# Patient Record
Sex: Male | Born: 1980 | Race: White | Hispanic: No | Marital: Married | Smoking: Current every day smoker
Health system: Southern US, Community
[De-identification: ages and names within clinical notes are randomized; demographics above are authoritative.]

---

## 2016-04-14 ENCOUNTER — Emergency Department
Admission: EM | Admit: 2016-04-14 | Discharge: 2016-04-14 | Disposition: A | Discharge status: Home / Self Care | Payer: Commercial Managed Care - HMO | Source: Home / Self Care | Attending: Family Medicine | Admitting: Family Medicine

## 2016-04-14 ENCOUNTER — Encounter: Payer: Self-pay | Admitting: Emergency Medicine

## 2016-04-14 DIAGNOSIS — H6693 Otitis media, unspecified, bilateral: Secondary | ICD-10-CM | POA: Diagnosis not present

## 2016-04-14 DIAGNOSIS — H6123 Impacted cerumen, bilateral: Secondary | ICD-10-CM

## 2016-04-14 MED ORDER — AMOXICILLIN 875 MG PO TABS: 875.0000 mg | ORAL_TABLET | Freq: Two times a day (BID) | ORAL | 0 refills | Status: DC

## 2016-04-14 MED ORDER — PREDNISONE 20 MG PO TABS: ORAL_TABLET | ORAL | 0 refills | Status: DC

## 2016-04-14 NOTE — ED Provider Notes (Signed)
[facility]    CSN: [id number] Arrival date & time: 04/14/16  1659     History   Chief Complaint Chief Complaint  Patient presents with  . Otalgia    HPI Raymond Cohen is a 36 y.o. male.   Patient states that he developed increased sinus congestion about two weeks ago.  One week ago his ears felt clogged, and now he has bilateral ear pain.  No fevers, chills, and sweats.  No URI symptoms.   The history is provided by the patient.    History reviewed. No pertinent past medical history.  There are no active problems to display for this patient.   History reviewed. No pertinent surgical history.     Home Medications    Prior to Admission medications   Medication Sig Start Date End Date Taking? Authorizing Provider  amoxicillin (AMOXIL) 875 MG tablet Take 1 tablet (875 mg total) by mouth 2 (two) times daily. 04/14/16   Lattie Haw, MD  predniSONE (DELTASONE) 20 MG tablet Take one tab by mouth twice daily for 5 days, then one daily. Take with food. 04/14/16   Lattie Haw, MD    Family History No family history on file.  Social History Social History  Substance Use Topics  . Smoking status: Current Every Day Smoker    Packs/day: 1.00    Types: Cigarettes  . Smokeless tobacco: Never Used  . Alcohol use Yes     Allergies   Patient has no known allergies.   Review of Systems Review of Systems No sore throat No cough No pleuritic pain No wheezing + nasal congestion + post-nasal drainage No sinus pain/pressure No itchy/red eyes + earache No hemoptysis No SOB No fever/chills No nausea No vomiting No abdominal pain No diarrhea No urinary symptoms No skin rash No fatigue No myalgias No headache Used OTC meds without relief   Physical Exam Triage Vital Signs ED Triage Vitals  Enc Vitals Group     BP 04/14/16 1726 (!) 144/103     Pulse Rate 04/14/16 1726 87     Resp --      Temp 04/14/16 1726 98.1 F (36.7 C)     Temp  Source 04/14/16 1726 Oral     SpO2 04/14/16 1726 97 %     Weight 04/14/16 1726 176 lb 8 oz (80.1 kg)     Height 04/14/16 1726 5\' 10"  (1.778 m)     Head Circumference --      Peak Flow --      Pain Score 04/14/16 1727 6     Pain Loc --      Pain Edu? --      Excl. in GC? --    No data found.   Updated Vital Signs BP (!) 144/103 (BP Location: Left Arm)   Pulse 87   Temp 98.1 F (36.7 C) (Oral)   Ht 5\' 10"  (1.778 m)   Wt 176 lb 8 oz (80.1 kg)   SpO2 97%   BMI 25.33 kg/m   Visual Acuity Right Eye Distance:   Left Eye Distance:   Bilateral Distance:    Right Eye Near:   Left Eye Near:    Bilateral Near:     Physical Exam Nursing notes and Vital Signs reviewed. Appearance:  Patient appears stated age, and in no acute distress Eyes:  Pupils are equal, round, and reactive to light and accomodation.  Extraocular movement is intact.  Conjunctivae are not inflamed  Ears:  Both canals occluded with cerumen.  Post lavage, the left tympanic membrane is erythematous with decreased landmarks.  The right canal is clear of cerumen except for a membranous fragment medially; consequently cannot completely visualize right tympanic membrane. Nose:  Mildly congested turbinates.  No sinus tenderness.   Pharynx:  Normal Neck:  Supple.  No adenopathy.    UC Treatments / Results  Labs (all labs ordered are listed, but only abnormal results are displayed)  Labs Reviewed -   Tympanometry:  Right ear tympanogram low peak height ("flat line") and large ear volume; Left ear tympanogram low peak height  EKG  EKG Interpretation None       Radiology No results found.  Procedures Procedures  Ear lavage by nurse.  Medications Ordered in UC Medications - No data to display   Initial Impression / Assessment and Plan / UC Course  I have reviewed the triage vital signs and the nursing notes.  Pertinent labs & imaging results that were available during my care of the patient were  reviewed by me and considered in my medical decision making (see chart for details).    Begin prednisone burst/taper, and amoxicillin. May add Pseudoephedrine (30mg , one or two every 4 to 6 hours) for sinus congestion.  Get adequate rest.   May use Afrin nasal spray (or generic oxymetazoline) twice daily for about 5 days and then discontinue.  Also recommend using saline nasal spray several times daily and saline nasal irrigation (AYR is a common brand).  Use Flonase nasal spray each morning after using Afrin nasal spray and saline nasal irrigation. Recommend follow-up with ENT as soon as possible.  Final Clinical Impressions(s) / UC Diagnoses   Final diagnoses:  Bilateral impacted cerumen  Bilateral otitis media, unspecified otitis media type    New Prescriptions New Prescriptions   AMOXICILLIN (AMOXIL) 875 MG TABLET    Take 1 tablet (875 mg total) by mouth 2 (two) times daily.   PREDNISONE (DELTASONE) 20 MG TABLET    Take one tab by mouth twice daily for 5 days, then one daily. Take with food.     Lattie HawStephen A Carsin Randazzo, MD 04/22/16 [phone]

## 2016-04-14 NOTE — ED Triage Notes (Signed)
Pt c/o bilateral ear pain x 1 week, no drainage, head congestion.

## 2016-04-14 NOTE — Discharge Instructions (Signed)
May add Pseudoephedrine (30mg, one or two every 4 to 6 hours) for sinus congestion.  Get adequate rest.   °May use Afrin nasal spray (or generic oxymetazoline) twice daily for about 5 days and then discontinue.  Also recommend using saline nasal spray several times daily and saline nasal irrigation (AYR is a common brand).  Use Flonase nasal spray each morning after using Afrin nasal spray and saline nasal irrigation. °  °

## 2016-04-15 ENCOUNTER — Telehealth: Payer: Self-pay

## 2016-07-29 ENCOUNTER — Encounter: Payer: Self-pay | Admitting: *Deleted

## 2016-07-29 ENCOUNTER — Emergency Department (INDEPENDENT_AMBULATORY_CARE_PROVIDER_SITE_OTHER): Payer: Commercial Managed Care - HMO

## 2016-07-29 ENCOUNTER — Ambulatory Visit (INDEPENDENT_AMBULATORY_CARE_PROVIDER_SITE_OTHER): Payer: Commercial Managed Care - HMO | Admitting: Family Medicine

## 2016-07-29 ENCOUNTER — Emergency Department
Admission: EM | Admit: 2016-07-29 | Discharge: 2016-07-29 | Disposition: A | Discharge status: Home / Self Care | Payer: Commercial Managed Care - HMO | Source: Home / Self Care | Attending: Family Medicine | Admitting: Family Medicine

## 2016-07-29 DIAGNOSIS — S62654A Nondisplaced fracture of medial phalanx of right ring finger, initial encounter for closed fracture: Secondary | ICD-10-CM

## 2016-07-29 DIAGNOSIS — W2107XA Struck by softball, initial encounter: Secondary | ICD-10-CM | POA: Diagnosis not present

## 2016-07-29 DIAGNOSIS — S62664A Nondisplaced fracture of distal phalanx of right ring finger, initial encounter for closed fracture: Secondary | ICD-10-CM

## 2016-07-29 DIAGNOSIS — S62604A Fracture of unspecified phalanx of right ring finger, initial encounter for closed fracture: Secondary | ICD-10-CM | POA: Diagnosis not present

## 2016-07-29 DIAGNOSIS — S62634A Displaced fracture of distal phalanx of right ring finger, initial encounter for closed fracture: Secondary | ICD-10-CM

## 2016-07-29 NOTE — ED Triage Notes (Signed)
Pt c/o RT 4th finger injury yesterday while playing softball.

## 2016-07-29 NOTE — Progress Notes (Signed)
Subjective:    I'm seeing this patient as a consultation for:  Dr Cathren Harsh  CC: right hand fracture  HPI: Raymond Cohen a injury to his right fourth digit yesterday while playing softball. A ball hit him on the tip of the finger. He had pain and swelling he presents to urgent care today where he was diagnosed with a fracture at both the DIP and PIP joints. 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 He notes that he cannot use a full hand splint. He notes the pain at this time is only mild and denies any fevers or chills.  Past medical history, Surgical history, Family history not pertinant except as noted below, Social history, Allergies, and medications have been entered into the medical record, reviewed, and no changes needed.   Review of Systems: No headache, visual changes, nausea, vomiting, diarrhea, constipation, dizziness, abdominal pain, skin rash, fevers, chills, night sweats, weight loss, swollen lymph nodes, body aches, joint swelling, muscle aches, chest pain, shortness of breath, mood changes, visual or auditory hallucinations.   Objective:   BP (!) 161/95 (BP Location: Left Arm)   Pulse 81   Temp 98.5 F (36.9 C) (Oral)   Resp 16   Wt 175 lb (79.4 kg)   SpO2 99%   BMI 25.11 kg/m  General: Well Developed, well nourished, and in no acute distress.  Neuro/Psych: Alert and oriented x3, extra-ocular muscles intact, able to move all 4 extremities, sensation grossly intact. Skin: Warm and dry, no rashes noted.  Respiratory: Not using accessory muscles, speaking in full sentences, trachea midline.  Cardiovascular: Pulses palpable, no extremity edema. Abdomen: Does not appear distended. MSK: Right fourth digit significant ecchymosis. Tender to palpation PIP and DIP. No obvious deformity present. Flexion and extension strength is intact to limited testing. Capillary refill and sensation are intact distally.  After some debate patient was given a  stack splint which he tolerated. This immobilized with the DIP and the PIP.  No results found for this or any previous visit (from the past 24 hour(s)). Dg Finger Ring Right  Result Date: 07/29/2016 CLINICAL DATA:  Softball hit finger EXAM: RIGHT FOURTH FINGER 2+V COMPARISON:  None. FINDINGS: Frontal, oblique, and lateral views were obtained. There is a fracture along the dorsal, lateral aspect of the proximal portion of the fourth distal phalanx. There is also a fracture along the volar aspect of the fourth middle phalanx. There is soft tissue swelling in the fourth PIP joint region. No other fractures are identified. No dislocations. Joint spaces appear normal. IMPRESSION: Fracture along the dorsal, lateral aspect of the proximal portion of the fourth distal phalanx with alignment near anatomic. Fracture along the volar aspect of the proximal portion of the fourth middle phalanx in near anatomic alignment. No other fractures. No dislocations. Marked soft tissue swelling along dorsal aspect of fourth PIP joint. These results will be called to the ordering clinician or representative by the Radiologist Assistant, and communication documented in the PACS or zVision Dashboard. Electronically Signed   By: Bretta Bang III M.D.   On: 07/29/2016 15:18    Impression and Recommendations:    Assessment and Plan: 36 y.o. male with  Right fourth digit fracture. This would be relatively easy to treat were not for Mr. Nigh occupation. 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 A stack splint seemed to offer a good compromise between functionality and immobilization. Plan to recheck in about  a week for fracture healing.   No orders of the defined types were placed in this encounter.  No orders of the defined types were placed in this encounter.   Discussed warning signs or symptoms. Please see discharge instructions. Patient expresses understanding.

## 2016-07-29 NOTE — ED Provider Notes (Signed)
[facility]    CSN: [id number] Arrival date & time: 07/29/16  1357     History   Chief Complaint Chief Complaint  Patient presents with  . Finger Injury    HPI Raymond Cohen is a 36 y.o. male.   Patient injured his right 4th finger playing softball yesterday.   The history is provided by the patient.  Hand Pain  This is a new problem. The current episode started yesterday. The problem occurs constantly. The problem has not changed since onset.Exacerbated by: finger flexion. Nothing relieves the symptoms. Treatments tried: ice pack. The treatment provided mild relief.    History reviewed. No pertinent past medical history.  Patient Active Problem List   Diagnosis Date Noted  . Closed nondisplaced fracture of phalanx of right ring finger 07/29/2016    History reviewed. No pertinent surgical history.     Home Medications    Prior to Admission medications   Not on File    Family History History reviewed. No pertinent family history.  Social History Social History  Substance Use Topics  . Smoking status: Current Every Day Smoker    Packs/day: 1.00    Types: Cigarettes  . Smokeless tobacco: Never Used  . Alcohol use Yes     Allergies   Patient has no known allergies.   Review of Systems Review of Systems  All other systems reviewed and are negative.    Physical Exam Triage Vital Signs ED Triage Vitals  Enc Vitals Group     BP 07/29/16 1501 (!) 161/95     Pulse Rate 07/29/16 1501 81     Resp 07/29/16 1501 16     Temp 07/29/16 1501 98.5 F (36.9 C)     Temp Source 07/29/16 1501 Oral     SpO2 07/29/16 1501 99 %     Weight 07/29/16 1501 175 lb (79.4 kg)     Height --      Head Circumference --      Peak Flow --      Pain Score 07/29/16 1502 1     Pain Loc --      Pain Edu? --      Excl. in GC? --    No data found.   Updated Vital Signs BP (!) 161/95 (BP Location: Left Arm)   Pulse 81   Temp 98.5 F (36.9 C) (Oral)    Resp 16   Wt 175 lb (79.4 kg)   SpO2 99%   BMI 25.11 kg/m   Visual Acuity Right Eye Distance:   Left Eye Distance:   Bilateral Distance:    Right Eye Near:   Left Eye Near:    Bilateral Near:     Physical Exam  Constitutional: He appears well-nourished. No distress.  HENT:  Head: Atraumatic.  Eyes: Conjunctivae are normal. Pupils are equal, round, and reactive to light.  Cardiovascular: Normal rate.   Pulmonary/Chest: Effort normal.  Musculoskeletal:       Right hand: He exhibits decreased range of motion, tenderness, bony tenderness and swelling. He exhibits normal two-point discrimination, normal capillary refill, no deformity and no laceration. Normal sensation noted.       Hands: Right 4th finger has distinct tenderness to palpation and swelling over the DIP joint, although flexion/extension is intact.  There is mild swelling but minimal tenderness over the PIP joint.  Distal neurovascular function is intact.   Neurological: He is alert.  Skin: Skin is warm and dry.  Nursing note and vitals  reviewed.    UC Treatments / Results  Labs (all labs ordered are listed, but only abnormal results are displayed) Labs Reviewed - No data to display  EKG  EKG Interpretation None       Radiology Dg Finger Ring Right  Result Date: 07/29/2016 CLINICAL DATA:  Softball hit finger EXAM: RIGHT FOURTH FINGER 2+V COMPARISON:  None. FINDINGS: Frontal, oblique, and lateral views were obtained. There is a fracture along the dorsal, lateral aspect of the proximal portion of the fourth distal phalanx. There is also a fracture along the volar aspect of the fourth middle phalanx. There is soft tissue swelling in the fourth PIP joint region. No other fractures are identified. No dislocations. Joint spaces appear normal. IMPRESSION: Fracture along the dorsal, lateral aspect of the proximal portion of the fourth distal phalanx with alignment near anatomic. Fracture along the volar aspect of the  proximal portion of the fourth middle phalanx in near anatomic alignment. No other fractures. No dislocations. Marked soft tissue swelling along dorsal aspect of fourth PIP joint. These results will be called to the ordering clinician or representative by the Radiologist Assistant, and communication documented in the PACS or zVision Dashboard. Electronically Signed   By: Bretta Bang III M.D.   On: 07/29/2016 15:18    Procedures Procedures (including critical care time)  Medications Ordered in UC Medications - No data to display   Initial Impression / Assessment and Plan / UC Course  I have reviewed the triage vital signs and the nursing notes.  Pertinent labs & imaging results that were available during my care of the patient were reviewed by me and considered in my medical decision making (see chart for details).    Patient referred to Dr. Denyse Amass for fracture management.    Final Clinical Impressions(s) / UC Diagnoses   Final diagnoses:  Closed nondisplaced fracture of middle phalanx of right ring finger, initial encounter  Closed nondisplaced fracture of distal phalanx of right ring finger, initial encounter    New Prescriptions There are no discharge medications for this patient.    Lattie Haw, MD 07/29/16 [phone]

## 2016-08-05 ENCOUNTER — Ambulatory Visit (INDEPENDENT_AMBULATORY_CARE_PROVIDER_SITE_OTHER): Payer: Commercial Managed Care - HMO | Admitting: Family Medicine

## 2016-08-05 ENCOUNTER — Encounter: Payer: Self-pay | Admitting: Family Medicine

## 2016-08-05 ENCOUNTER — Ambulatory Visit (INDEPENDENT_AMBULATORY_CARE_PROVIDER_SITE_OTHER): Payer: Commercial Managed Care - HMO

## 2016-08-05 VITALS — BP 126/86 | HR 74 | Wt 180.0 lb

## 2016-08-05 DIAGNOSIS — S62604A Fracture of unspecified phalanx of right ring finger, initial encounter for closed fracture: Secondary | ICD-10-CM | POA: Diagnosis not present

## 2016-08-05 DIAGNOSIS — S62634D Displaced fracture of distal phalanx of right ring finger, subsequent encounter for fracture with routine healing: Secondary | ICD-10-CM | POA: Diagnosis not present

## 2016-08-05 DIAGNOSIS — W2103XD Struck by baseball, subsequent encounter: Secondary | ICD-10-CM

## 2016-08-05 NOTE — Progress Notes (Signed)
   Raymond BastJason Cohen is a 36 y.o. male who [facility] for follow-up finger fracture. Patient was seen last week for fracture of the right fourth digit at the DIP and possibly the PIP. He was treated with a Stax splint. He notes the pain is overall improving.   No past medical history on file. No past surgical history on file. Social History  Substance Use Topics  . Smoking status: Current Every Day Smoker    Packs/day: 1.00    Types: Cigarettes  . Smokeless tobacco: Never Used  . Alcohol use Yes     ROS:  As above   Medications: No current outpatient prescriptions on file.   No current facility-administered medications for this visit.    No Known Allergies   Exam:  BP 126/86   Pulse 74   Wt 180 lb (81.6 kg)   BMI 25.83 kg/m  General: Well Developed, well nourished, and in no acute distress.  Neuro/Psych: Alert and oriented x3, extra-ocular muscles intact, able to move all 4 extremities, sensation grossly intact. Skin: Warm and dry, no rashes noted.  Respiratory: Not using accessory muscles, speaking in full sentences, trachea midline.  Cardiovascular: Pulses palpable, no extremity edema. Abdomen: Does not appear distended. MSK: Right hand improved appearance with less swelling and ecchymosis. Tender palpation at DIP. Slight extensor lag present however patient does have extensor strength intact. Sensation is intact distally  X-ray right finger reveals a fracture of the dorsal DIP with no worsening separation or displacement.   No results found for this or any previous visit (from the past 48 hour(s)). No results found.    Assessment and Plan: 36 y.o. male with right fourth digit fracture certainly at the DIP possibly at the PIP. Plan to continue stax splint.  I also started the patient double Band-Aid splint encase the Stax splint is not workable with his job. Plan to recheck in 3-4 weeks.     Orders  Placed This Encounter  Procedures  . DG Finger Ring Right    Order Specific Question:   Reason for exam:    Answer:   f/u fx    Order Specific Question:   [facility]?    [facility]   No orders of the defined types were placed in this encounter.   Discussed warning signs or symptoms. Please see discharge instructions. Patient expresses understanding.

## 2016-08-05 NOTE — Patient Instructions (Signed)
Thank you for coming in today. Keep that joint immobilized the best you can at work.  Recheck in 3-4 weeks.  Return sooner if worsening.    Mallet Finger Mallet finger is an injury that occurs from a blow to the tip of your straightened finger or thumb. It is also known as baseball finger. The blow to your fingertip causes it to bend farther than normal, which tears the cord that attaches to the tip of your finger (extensor tendon). Your extensor tendon is what straightens the end of your finger. If this tendon is damaged, you will not be able to straighten your fingertip. Sometimes, a piece of bone may be pulled away with the tendon (avulsion injury), or the tendon may tear completely. In some cases, surgery may be required to repair the damage. What are the causes? Mallet finger is caused by a hard, direct hit to the tip of your finger or thumb. This injury often happens from getting hit in the finger with a hard ball, such as a baseball. What increases the risk? This injury is more likely to happen if you play sports that use a hard ball. What are the signs or symptoms? The main symptom of this injury is not being able to straighten the tip of your finger. You can manually straighten your fingertip with your other hand, but the finger cannot straighten on its own. Other symptoms may include:  Pain.  Swelling.  Bruising.  Blood under the fingernail.  How is this diagnosed? Your health care provider may suspect mallet finger if you are not able to extend your fingertip, especially if you recently injured your hand. Your health care provider will do a physical exam. This may include X-rays to see if a piece of bone has been pulled away or if the finger joint has separated (dislocated). How is this treated? Mallet finger may be treated with:  Wearing a splint on your fingertip to keep it straight (extended) while the tendon heals.  Surgery to repair the tendon, in severe cases. This may  involve: ? The use of a pin or screw to keep your finger extended and your tendon attached. ? Taking a piece of tendon from another part of your body (graft) to replace a torn tendon.  Follow these instructions at home:  Take medicines only as directed by your health care provider.  Wear the splint as directed by your health care provider. Remove it only as directed by your health care provider.  If you take your splint off to dry it or change it, gently press your finger on a flat surface to keep it straight.  If directed, apply ice to the injured area: ? Put ice in a plastic bag. ? Place a towel between your skin and the bag. ? Leave the ice on for 20 minutes, 2-3 times a day.  Raise the injured area above the level of your heart while you are sitting or lying down. Contact a health care provider if:  You have pain or swelling that is getting worse.  Your finger feels cold.  You cannot extend your finger after treatment. Get help right away if:  Even after loosening your splint, your finger is: ? Very red and swollen. ? White or blue. ? Numb or tingling. This information is not intended to replace advice given to you by your health care provider. Make sure you discuss any questions you have with your health care provider. Document Released: 02/09/2000 Document Revised: 07/20/2015 Document  Reviewed: 12/15/2013 Elsevier Interactive Patient Education  [employer].

## 2016-09-09 ENCOUNTER — Encounter: Payer: Self-pay | Admitting: Family Medicine

## 2016-09-09 ENCOUNTER — Ambulatory Visit (INDEPENDENT_AMBULATORY_CARE_PROVIDER_SITE_OTHER): Payer: 59

## 2016-09-09 ENCOUNTER — Ambulatory Visit (INDEPENDENT_AMBULATORY_CARE_PROVIDER_SITE_OTHER): Payer: Commercial Managed Care - HMO | Admitting: Family Medicine

## 2016-09-09 VITALS — BP 139/96 | HR 80 | Ht 70.0 in | Wt 183.0 lb

## 2016-09-09 DIAGNOSIS — W2107XD Struck by softball, subsequent encounter: Secondary | ICD-10-CM | POA: Diagnosis not present

## 2016-09-09 DIAGNOSIS — S62624D Displaced fracture of medial phalanx of right ring finger, subsequent encounter for fracture with routine healing: Secondary | ICD-10-CM | POA: Diagnosis not present

## 2016-09-09 DIAGNOSIS — S62634D Displaced fracture of distal phalanx of right ring finger, subsequent encounter for fracture with routine healing: Secondary | ICD-10-CM

## 2016-09-09 DIAGNOSIS — S62604A Fracture of unspecified phalanx of right ring finger, initial encounter for closed fracture: Secondary | ICD-10-CM | POA: Diagnosis not present

## 2016-09-09 NOTE — Progress Notes (Signed)
   [name] is a 36 y.o. male who [facility] for follow-up fracture right fourth digit. Patient was originally seen June 4 for fracture at the DIP and PIP of the right fourth digit. In the interim he's had immobilization intermittently of the DIP joint. 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 Most of the time is able to use the Stax splint. He notes he still has some swelling and mild tenderness at the DIP but overall he is feeling much better. 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   No past medical history on file. No past surgical history on file. Social History  Substance Use Topics  . Smoking status: Current Every Day Smoker    Packs/day: 1.00    Types: Cigarettes  . Smokeless tobacco: Never Used  . Alcohol use Yes     ROS:  As above   Medications: No current outpatient prescriptions on file.   No current facility-administered medications for this visit.    No Known Allergies   Exam:  BP (!) 139/96   Pulse 80   Ht 5\' 10"  (1.778 m)   Wt 183 lb (83 kg)   BMI 26.26 kg/m  General: Well Developed, well nourished, and in no acute distress.  Neuro/Psych: Alert and oriented x3, extra-ocular muscles intact, able to move all 4 extremities, sensation grossly intact. Skin: Warm and dry, no rashes noted.  Respiratory: Not using accessory muscles, speaking in full sentences, trachea midline.  Cardiovascular: Pulses palpable, no extremity edema. Abdomen: Does not appear distended. MSK: Right fourth digit DIP slightly swollen and red nontender decreased motion. Capillary refill and sensation are intact distally. He is nontender with normal motion.    No results found for this or any previous visit (from the past 48 hour(s)). Dg Finger Ring Right  Result Date: 09/09/2016 CLINICAL DATA:  Follow-up fourth distal phalangeal fracture EXAM:  RIGHT RING FINGER 2+V COMPARISON:  08/05/2016 FINDINGS: Previously seen fracture of the fourth distal phalanx is again appreciated. No significant callus formation is noted. Fracture at the base of the middle phalanx is noted as well. No new focal abnormality is seen. IMPRESSION: Stable fractures of the fourth middle and distal phalanges. Electronically Signed   By: Alcide CleverMark  Lukens M.D.   On: 09/09/2016 11:28      Assessment and Plan: 36 y.o. male with stable fracture at the DIP. No obvious callus formation. Patient is clinically doing very well. Plan to continue mobilization for 2 more weeks then start hand physical therapy. Recheck in about a month.    Orders Placed This Encounter  Procedures  . DG Finger Ring Right    Order Specific Question:   Reason for exam:    Answer:   eval fx at DIP    Order Specific Question:   Preferred imaging location?    [facility]  . Ambulatory referral to Physical Therapy    Referral Priority:   Routine    Referral Type:   Physical Medicine    Referral Reason:   Specialty Services Required    Requested Specialty:   Physical Therapy    Number of Visits Requested:   1   No orders of the defined types were placed in this encounter.   Discussed warning signs or symptoms. Please see discharge instructions. Patient expresses understanding.

## 2016-09-09 NOTE — Patient Instructions (Signed)
Thank you for coming in today. Attend Hand PT.  Recheck in 1 month or so.  Return sooner if needed.   Brace for another 2 weeks with normal activity and with high impact activity for longer.

## 2016-10-07 ENCOUNTER — Ambulatory Visit: Payer: Commercial Managed Care - HMO | Admitting: Family Medicine

## 2016-10-07 DIAGNOSIS — Z0189 Encounter for other specified special examinations: Secondary | ICD-10-CM

## 2018-06-26 IMAGING — DX DG FINGER RING 2+V*R*
3 series · 3 of 3 positions shown · non-contrast
Comparison: 08/05/2016

CLINICAL DATA: Follow-up fourth distal phalangeal fracture

EXAM:
RIGHT RING FINGER 2+V

[finger ap]
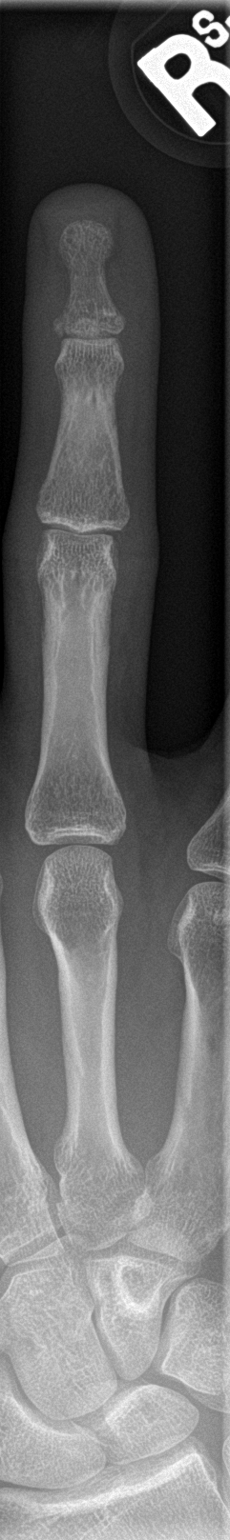

[finger obl]
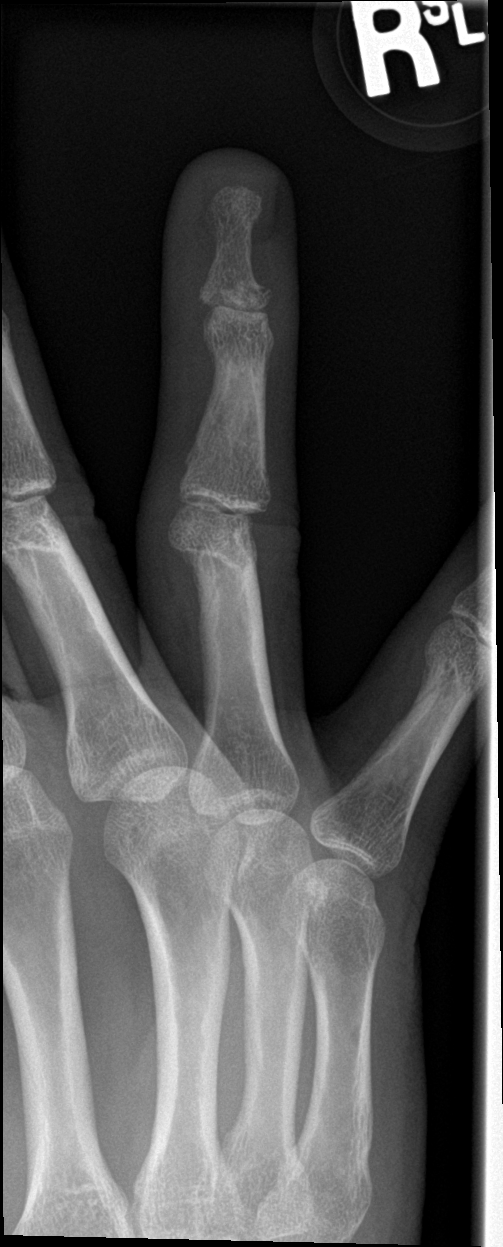

[finger lat]
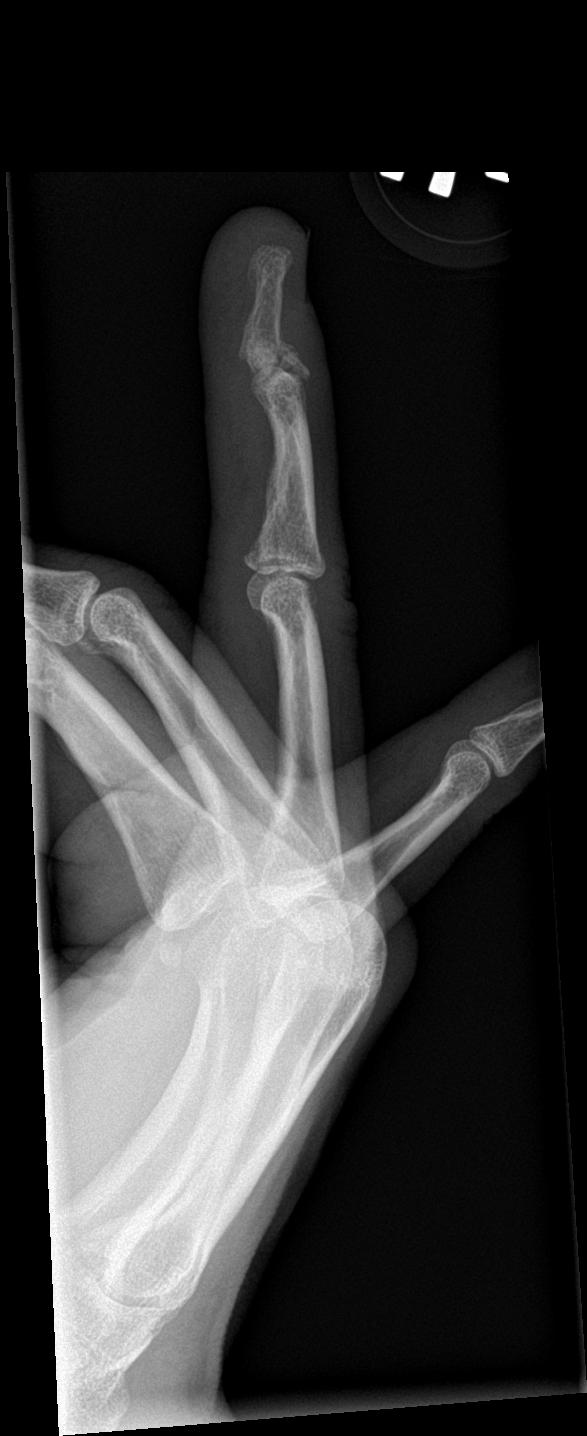

[3 of 3 positions shown; findings below may reference images not displayed]

FINDINGS: Previously seen fracture of the fourth distal phalanx is again
appreciated. No significant callus formation is noted. Fracture at
the base of the middle phalanx is noted as well. No new focal
abnormality is seen.
IMPRESSION: Stable fractures of the fourth middle and distal phalanges.

## 2019-03-04 ENCOUNTER — Encounter: Payer: Self-pay | Admitting: Emergency Medicine

## 2019-03-04 ENCOUNTER — Other Ambulatory Visit: Payer: Self-pay

## 2019-03-04 ENCOUNTER — Emergency Department
Admission: EM | Admit: 2019-03-04 | Discharge: 2019-03-04 | Disposition: A | Discharge status: Home / Self Care | Payer: BC Managed Care – PPO | Source: Home / Self Care | Attending: Family Medicine | Admitting: Family Medicine

## 2019-03-04 DIAGNOSIS — Z20822 Contact with and (suspected) exposure to covid-19: Secondary | ICD-10-CM | POA: Diagnosis not present

## 2019-03-04 NOTE — ED Triage Notes (Signed)
Exposure to covid 7 days ago, asymptomatic

## 2019-03-04 NOTE — Discharge Instructions (Addendum)
Isolate yourself until COVID-19 test result is available.   If your COVID19 test is positive, then you are infected with the novel coronavirus and could give the virus to others.  Please continue isolation at home for at least 10 days since the start of your symptoms. If you do not have symptoms, please isolate at home for 10 days from the day you were tested. Once you complete your 10 day quarantine, you may return to normal activities as long as you've not had a fever for over 24 hours (without taking fever reducing medicine) and your symptoms are improving. Please continue good preventive care measures, including:  frequent hand-washing, avoid touching your face, cover coughs/sneezes, stay out of crowds and keep a 6 foot distance from others.  Go to the nearest hospital emergency room if fever/cough/breathlessness are severe or illness seems like a threat to life.  

## 2019-03-04 NOTE — ED Provider Notes (Signed)
[facility]    CSN: [id number] Arrival date & time: 03/04/19  1503      History   Chief Complaint Chief Complaint  Patient presents with  . exposure To covid    HPI Rayshaun Needle is a 39 y.o. male.   Patient presents for COVID19 testing.  He is assymptomatic at present.  The history is provided by the patient.    History reviewed. No pertinent past medical history.  Patient Active Problem List   Diagnosis Date Noted  . Closed nondisplaced fracture of phalanx of right ring finger 07/29/2016    History reviewed. No pertinent surgical history.     Home Medications    Prior to Admission medications   Not on File    Family History Family History  Problem Relation Age of Onset  . Stroke Mother   . Healthy Father     Social History Social History   Tobacco Use  . Smoking status: Current Every Day Smoker    Packs/day: 1.00    Types: Cigarettes  . Smokeless tobacco: Never Used  Substance Use Topics  . Alcohol use: Yes  . Drug use: No     Allergies   Patient has no known allergies.   Review of Systems Review of Systems No sore throat No cough No pleuritic pain No wheezing No nasal congestion No post-nasal drainage No sinus pain/pressure No itchy/red eyes No earache No hemoptysis No SOB No fever/chills No nausea No vomiting No abdominal pain No diarrhea No urinary symptoms No skin rash No fatigue No myalgias No headache   Physical Exam Triage Vital Signs ED Triage Vitals  Enc Vitals Group     BP 03/04/19 1607 (!) 153/112     Pulse Rate 03/04/19 1607 88     Resp --      Temp 03/04/19 1607 98.7 F (37.1 C)     Temp Source 03/04/19 1607 Oral     SpO2 03/04/19 1607 98 %     Weight 03/04/19 1608 175 lb (79.4 kg)     Height 03/04/19 1608 5\' 10"  (1.778 m)     Head Circumference --      Peak Flow --      Pain Score 03/04/19 1608 0     Pain Loc --      Pain Edu? --      Excl. in [place]? --    No data found.  Updated  Vital Signs BP (!) 153/112 (BP Location: Right Arm)   Pulse 88   Temp 98.7 F (37.1 C) (Oral)   Ht 5\' 10"  (1.778 m)   Wt 79.4 kg   SpO2 98%   BMI 25.11 kg/m   Visual Acuity Right Eye Distance:   Left Eye Distance:   Bilateral Distance:    Right Eye Near:   Left Eye Near:    Bilateral Near:     Physical Exam Nursing notes and Vital Signs reviewed. Appearance:  Patient appears stated age, and in no acute distress Eyes:  Pupils are equal, round, and reactive to light and accomodation.  Extraocular movement is intact.  Conjunctivae are not inflamed  Ears:  Canals normal.  Tympanic membranes normal.  Nose:  Mildly congested turbinates.  No sinus tenderness.  Pharynx:  Normal Neck:  Supple. No adenopathy. Lungs:  Clear to auscultation.  Breath sounds are equal.  Moving air well. Heart:  Regular rate and rhythm without murmurs, rubs, or gallops.  Abdomen:  Nontender without masses or hepatosplenomegaly.  Bowel  sounds are present.  No CVA or flank tenderness.  Extremities:  No edema.  Skin:  No rash present.   UC Treatments / Results  Labs (all labs ordered are listed, but only abnormal results are displayed) Labs Reviewed  NOVEL CORONAVIRUS, NAA    EKG   Radiology No results found.  Procedures Procedures (including critical care time)  Medications Ordered in UC Medications - No data to display  Initial Impression / Assessment and Plan / UC Course  I have reviewed the triage vital signs and the nursing notes.  Pertinent labs & imaging results that were available during my care of the patient were reviewed by me and considered in my medical decision making (see chart for details).    Benign exam. COVID19 send out.   Final Clinical Impressions(s) / UC Diagnoses   Final diagnoses:  Exposure to COVID-19 virus     Discharge Instructions     Isolate yourself until COVID-19 test result is available.   If your COVID19 test is positive, then you are infected  with the novel coronavirus and could give the virus to others.  Please continue isolation at home for at least 10 days since the start of your symptoms. If you do not have symptoms, please isolate at home for 10 days from the day you were tested. Once you complete your 10 day quarantine, you may return to normal activities as long as you've not had a fever for over 24 hours (without taking fever reducing medicine) and your symptoms are improving. Please continue good preventive care measures, including:  frequent hand-washing, avoid touching your face, cover coughs/sneezes, stay out of crowds and keep a 6 foot distance from others.  Go to the nearest hospital emergency room if fever/cough/breathlessness are severe or illness seems like a threat to life.     ED Prescriptions    None        Lattie Haw, MD 03/06/19 1450

## 2019-03-06 LAB — NOVEL CORONAVIRUS, NAA: SARS-CoV-2, NAA: NOT DETECTED
